# Patient Record
Sex: Female | Born: 1950 | Race: White | Hispanic: No | Marital: Single | Smoking: Never smoker
Health system: Southern US, Community
[De-identification: ages and names within clinical notes are randomized; demographics above are authoritative.]

## PROBLEM LIST (undated history)

## (undated) HISTORY — PX: TUBAL LIGATION: SHX77

---

## 2010-12-01 ENCOUNTER — Emergency Department (HOSPITAL_COMMUNITY): Payer: Self-pay

## 2010-12-01 ENCOUNTER — Inpatient Hospital Stay (HOSPITAL_COMMUNITY)
Admission: EM | Admit: 2010-12-01 | Discharge: 2010-12-06 | DRG: 087 | Disposition: A | Discharge status: Home / Self Care | Payer: No Typology Code available for payment source | Attending: General Surgery | Admitting: General Surgery

## 2010-12-01 DIAGNOSIS — S065X9A Traumatic subdural hemorrhage with loss of consciousness of unspecified duration, initial encounter: Secondary | ICD-10-CM

## 2010-12-01 DIAGNOSIS — S065XAA Traumatic subdural hemorrhage with loss of consciousness status unknown, initial encounter: Secondary | ICD-10-CM | POA: Diagnosis present

## 2010-12-01 DIAGNOSIS — M542 Cervicalgia: Secondary | ICD-10-CM | POA: Diagnosis present

## 2010-12-01 DIAGNOSIS — S0003XA Contusion of scalp, initial encounter: Secondary | ICD-10-CM

## 2010-12-01 DIAGNOSIS — S1093XA Contusion of unspecified part of neck, initial encounter: Secondary | ICD-10-CM

## 2010-12-01 DIAGNOSIS — S0083XA Contusion of other part of head, initial encounter: Secondary | ICD-10-CM

## 2010-12-01 LAB — PROTIME-INR: INR: 1.07 (ref 0.00–1.49)

## 2010-12-01 LAB — COMPREHENSIVE METABOLIC PANEL
ALT: 18 U/L (ref 0–35)
AST: 21 U/L (ref 0–37)
Calcium: 9.6 mg/dL (ref 8.4–10.5)
Sodium: 139 mEq/L (ref 135–145)
Total Protein: 7.4 g/dL (ref 6.0–8.3)

## 2010-12-01 LAB — CBC
HCT: 37.4 % (ref 36.0–46.0)
Platelets: 276 10*3/uL (ref 150–400)
RDW: 12.6 % (ref 11.5–15.5)
WBC: 11.7 10*3/uL — ABNORMAL HIGH (ref 4.0–10.5)

## 2010-12-01 LAB — LACTIC ACID, PLASMA: Lactic Acid, Venous: 1.7 mmol/L (ref 0.5–2.2)

## 2010-12-01 MED ORDER — IOHEXOL 300 MG/ML  SOLN
80.0000 mL | Freq: Once | INTRAMUSCULAR | Status: AC | PRN
  Administered 2010-12-01: 80 mL via INTRAVENOUS

## 2010-12-02 ENCOUNTER — Inpatient Hospital Stay (HOSPITAL_COMMUNITY): Payer: No Typology Code available for payment source

## 2010-12-02 MED ORDER — MORPHINE SULFATE 2 MG/ML IJ SOLN: 2.0000 mg | INTRAMUSCULAR | Status: DC | PRN

## 2010-12-02 MED ORDER — KCL IN DEXTROSE-NACL 20-5-0.45 MEQ/L-%-% IV SOLN
INTRAVENOUS | Status: DC
  Administered 2010-12-02 – 2010-12-03 (×3): via INTRAVENOUS
  Filled 2010-12-02 (×8): qty 1000

## 2010-12-02 MED ORDER — HYDROCODONE-ACETAMINOPHEN 5-325 MG PO TABS
1.0000 | ORAL_TABLET | ORAL | Status: DC | PRN
  Administered 2010-12-05: 1 via ORAL
  Administered 2010-12-05: 2 via ORAL
  Administered 2010-12-05 – 2010-12-06 (×4): 1 via ORAL

## 2010-12-02 MED ORDER — MORPHINE SULFATE 2 MG/ML IJ SOLN: 1.0000 mg | INTRAMUSCULAR | Status: DC | PRN

## 2010-12-02 MED ORDER — ONDANSETRON HCL 4 MG PO TABS
4.0000 mg | ORAL_TABLET | ORAL | Status: DC | PRN
Start: 2010-12-01 — End: 2010-12-02

## 2010-12-02 MED ORDER — MORPHINE SULFATE 4 MG/ML IJ SOLN: 4.0000 mg | INTRAMUSCULAR | Status: DC | PRN

## 2010-12-02 MED ORDER — BISACODYL 10 MG RE SUPP
10.0000 mg | RECTAL | Status: DC | PRN
  Administered 2010-12-03: 10 mg via RECTAL
  Filled 2010-12-02: qty 1

## 2010-12-02 MED ORDER — ONDANSETRON HCL 4 MG/2ML IJ SOLN
4.0000 mg | INTRAMUSCULAR | Status: DC | PRN
  Administered 2010-12-03 – 2010-12-04 (×2): 4 mg via INTRAVENOUS
  Filled 2010-12-02 (×2): qty 2

## 2010-12-02 MED ORDER — MORPHINE SULFATE 2 MG/ML IJ SOLN
1.0000 mg | INTRAMUSCULAR | Status: DC | PRN
  Administered 2010-12-03: 2 mg via INTRAVENOUS

## 2010-12-02 MED ORDER — DOCUSATE SODIUM 100 MG PO CAPS
100.0000 mg | ORAL_CAPSULE | Freq: Two times a day (BID) | ORAL | Status: DC
  Administered 2010-12-03 – 2010-12-06 (×6): 100 mg via ORAL
  Filled 2010-12-02 (×2): qty 1

## 2010-12-02 MED ORDER — PROMETHAZINE HCL 25 MG/ML IJ SOLN
12.5000 mg | Freq: Four times a day (QID) | INTRAMUSCULAR | Status: DC | PRN
  Administered 2010-12-03 (×2): 12.5 mg via INTRAVENOUS
  Filled 2010-12-02 (×4): qty 1

## 2010-12-02 MED ORDER — PANTOPRAZOLE SODIUM 40 MG IV SOLR
40.0000 mg | INTRAVENOUS | Status: DC
  Filled 2010-12-02 (×2): qty 40

## 2010-12-03 DIAGNOSIS — S065X9A Traumatic subdural hemorrhage with loss of consciousness of unspecified duration, initial encounter: Secondary | ICD-10-CM | POA: Diagnosis present

## 2010-12-03 DIAGNOSIS — S0003XA Contusion of scalp, initial encounter: Secondary | ICD-10-CM | POA: Diagnosis present

## 2010-12-03 LAB — BASIC METABOLIC PANEL
BUN: 7 mg/dL (ref 6–23)
CO2: 29 mEq/L (ref 19–32)
Chloride: 105 mEq/L (ref 96–112)
Creatinine, Ser: 0.6 mg/dL (ref 0.50–1.10)

## 2010-12-03 MED ORDER — MORPHINE SULFATE 2 MG/ML IJ SOLN: 2.0000 mg | INTRAMUSCULAR | Status: DC | PRN

## 2010-12-03 MED ORDER — MORPHINE SULFATE 2 MG/ML IJ SOLN
2.0000 mg | INTRAMUSCULAR | Status: DC | PRN
  Administered 2010-12-03 (×2): 2 mg via INTRAVENOUS

## 2010-12-03 MED ORDER — MORPHINE SULFATE 4 MG/ML IJ SOLN
2.0000 mg | INTRAMUSCULAR | Status: DC | PRN
  Administered 2010-12-04: 2 mg via INTRAVENOUS
  Administered 2010-12-04: 4 mg via INTRAVENOUS
  Administered 2010-12-04: 2 mg via INTRAVENOUS
  Administered 2010-12-04: 4 mg via INTRAVENOUS
  Administered 2010-12-04: 2 mg via INTRAVENOUS
  Administered 2010-12-04: 4 mg via INTRAVENOUS
  Administered 2010-12-05 (×2): 2 mg via INTRAVENOUS
  Filled 2010-12-03 (×6): qty 1
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
## 2010-12-04 MED ORDER — WHITE PETROLATUM GEL
Status: AC
  Filled 2010-12-04: qty 5
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
## 2010-12-04 NOTE — Progress Notes (Signed)
This patient has been seen and I agree with the findings and treatment plan.  Jaxton Casale O. Trung Wenzl, III, MD, FACS [phone] (pager) [phone] (direct pager) Trauma Surgeon  

## 2010-12-05 LAB — BASIC METABOLIC PANEL
CO2: 28 mEq/L (ref 19–32)
Calcium: 9.7 mg/dL (ref 8.4–10.5)
Chloride: 99 mEq/L (ref 96–112)
Creatinine, Ser: 0.61 mg/dL (ref 0.50–1.10)
Glucose, Bld: 112 mg/dL — ABNORMAL HIGH (ref 70–99)

## 2010-12-05 MED ORDER — ONDANSETRON HCL 4 MG PO TABS: 4.0000 mg | ORAL_TABLET | ORAL | Status: DC | PRN

## 2010-12-05 MED ORDER — HYDROCODONE-ACETAMINOPHEN 5-325 MG PO TABS: 1.0000 | ORAL_TABLET | ORAL | Status: DC | PRN

## 2010-12-05 MED ORDER — ONDANSETRON HCL 4 MG PO TABS: 4.0000 mg | ORAL_TABLET | ORAL | Status: AC | PRN
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
## 2010-12-05 NOTE — Plan of Care (Signed)
Problem: Phase II Progression Outcomes Goal: Other Phase II Outcomes/Goals Cognitive evaluation completed.

## 2010-12-05 NOTE — Progress Notes (Deleted)
Speech Language/Pathology Speech Language Pathology Treatment Patient Details Name: Patty Tucker MRN: [id number] DOB: 1950/09/24 Today's Date: 12/05/2010  SLP Assessment/Plan/Recommendation Assessment Clinical Impression Statement: Pt exhibits impairment of attention, awareness of deficits, poor recall, thought organization, mental flexibility, sequencing, reasoning, judgement and safety.  Pt lives with fiance, who is currently hospitalized as well.  Pt does not present as being safe to be alone at home, due to cognitive deficits. SLP Recommendation/Assessment: All further Speech Lanaguage Pathology  needs can be addressed in the next venue of care Recommendation Equipment Recommended: None recommended by SLP  SLP Goals     SLP Treatment General    Oral Cavity - Oral Hygiene     Assessment of Diet Tolerance Clinical Impression Statement: Pt exhibits impairment of attention, awareness of deficits, poor recall, thought organization, mental flexibility, sequencing, reasoning, judgement and safety.  Pt lives with fiance, who is currently hospitalized as well.  Pt does not present as being safe to be alone at home, due to cognitive deficits.   Leigh Aurora 12/05/2010, 12:52 PM
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
## 2010-12-05 NOTE — Progress Notes (Signed)
Physical Therapy Patient Details Name: Patty Tucker MRN: [id number] DOB: 1950/11/17 Today's Date: 12/05/2010  Pt demonstrated intermittent difficulties with cognition and decreased ability to attend to tasks with external distractions.  Pt's inability to divide attention affects balance and safe mobility.  Pt will be D/Cing home alone and notes she may be able to get family to A, but wasn't sure.  Spoke with SLP who will do a Cognitive Eval about concerns.  Concerned about pt's safety if home alone.    [facility] 12/05/2010, 11:58 AM
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
## 2010-12-06 NOTE — Progress Notes (Signed)
Physical Therapy Treatment Patient Details Name: Patty Tucker MRN: [id number] DOB: 03-28-1950 Today's Date: 12/06/2010  PT Assessment/Plan  PT - Assessment/Plan Comments on Treatment Session: Pt doing well.  she is ready for DC.  Pt wanting to go home and anticipates no problems.  I educated pt on how to slowly increase her activity level and using ice pack for (forehead) pain management PT Plan: Discharge plan remains appropriate;All goals met and education completed, patient dischaged from PT services Follow Up Recommendations: None Equipment Recommended: None recommended by PT PT Goals  Acute Rehab PT Goals PT Goal Formulation: With patient Pt will Ambulate: >150 feet;Independently PT Goal: Ambulate - Progress: Met Pt will Go Up / Down Stairs: Other (comment) (Pt verbalized technque adn didnt feel she needs to practice) PT Goal: Up/Down Stairs - Progress: Met  PT Treatment Precautions/Restrictions  Precautions Precautions: Fall Restrictions Weight Bearing Restrictions: No Mobility (including Balance) Bed Mobility Bed Mobility: No Transfers Transfers: Yes Sit to Stand: 7: Independent Stand to Sit: 7: Independent Ambulation/Gait Ambulation/Gait: Yes Ambulation/Gait Assistance: 5: Supervision Ambulation/Gait Assistance Details (indicate cue type and reason): Pt able to do marching, walking on toes, walking on heels and with head turns without any loss of balance or increased pain Ambulation Distance (Feet): 250 Feet Assistive device: None Gait Pattern: Within Functional Limits Gait velocity: Pt walks slowly.  I had her increase her speed without any problems wtih balance Stairs: No (Pt doesnt think she will have any problems with her steps ) Wheelchair Mobility Wheelchair Mobility: No   End of Session PT - End of Session Activity Tolerance: Patient tolerated treatment well Patient left: in chair General Behavior During Session: [employer] for tasks performed Cognition: [facility]  for tasks performed  Judson Roch 12/06/2010, 9:48 AM
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
## 2010-12-06 NOTE — Progress Notes (Signed)
     Subjective: Still ok for discharge.  Objective: Vital signs in last 24 hours: Temp:  [98 F (36.7 C)-98.8 F (37.1 C)] 98.5 F (36.9 C) (11/07 0536) Pulse Rate:  [64-74] 64  (11/07 0536) Resp:  [16-20] 17  (11/07 0536) BP: (113-144)/(65-75) 113/72 mmHg (11/07 0536) SpO2:  [95 %-97 %] 95 % (11/07 0536) Weight:  [63.368 kg (139 lb 11.2 oz)] 139 lb 11.2 oz (63.368 kg) (11/06 0944) Last BM Date: 12/03/10   General appearance: no distress Resp: clear to auscultation bilaterally Neurologic: Grossly normal  Assessment/Plan: D/c to home   LOS: 5 days    Jlyn Bracamonte J. 12/06/2010

## 2010-12-06 NOTE — Progress Notes (Signed)
Pt discharged home. Stated understanding of all discharge instructions, followups, medicines, prescriptions, and when to call the doctor. Pt left with 36 percocet tablets from our pharmacy and 4 zofran tablets. Pt discharged home with sister. Minor, Patty Tucker

## 2010-12-06 NOTE — Progress Notes (Signed)
CSW completed initial assessment with patient - assessment located on shadow chart.  Patient plans to dc home with fiance and assistance from patient sister.  Patient sister is to provide transportation home.  Patient states she does not drink any alcohol.  CSW completed SBIRT.  No further SW needs.  CSW signing off.  [address] [place], [place] [phone]

## 2010-12-14 ENCOUNTER — Telehealth: Payer: Self-pay | Admitting: Physician Assistant

## 2010-12-14 MED ORDER — HYDROCODONE-ACETAMINOPHEN 5-325 MG PO TABS: 1.0000 | ORAL_TABLET | ORAL | Status: AC | PRN

## 2010-12-14 NOTE — Telephone Encounter (Signed)
Called in refill for hydrocodone 

## 2010-12-23 ENCOUNTER — Emergency Department: Payer: Self-pay | Admitting: Emergency Medicine

## 2010-12-27 ENCOUNTER — Telehealth: Payer: Self-pay | Admitting: Physician Assistant

## 2010-12-27 NOTE — Telephone Encounter (Signed)
Left message with pt.'s boyfriend, [employer], that we would not refill Norco Rx again. Let the boyfriend know that she needs to follow up with her Neurosurgeon or her PCP

## 2011-07-01 ENCOUNTER — Observation Stay (HOSPITAL_COMMUNITY)
Admission: EM | Admit: 2011-07-01 | Discharge: 2011-07-02 | Disposition: A | Discharge status: Home / Self Care | Payer: Self-pay | Attending: Emergency Medicine | Admitting: Emergency Medicine

## 2011-07-01 ENCOUNTER — Encounter (HOSPITAL_COMMUNITY): Payer: Self-pay | Admitting: *Deleted

## 2011-07-01 DIAGNOSIS — IMO0001 Reserved for inherently not codable concepts without codable children: Secondary | ICD-10-CM | POA: Insufficient documentation

## 2011-07-01 DIAGNOSIS — Z23 Encounter for immunization: Secondary | ICD-10-CM | POA: Insufficient documentation

## 2011-07-01 DIAGNOSIS — L0390 Cellulitis, unspecified: Secondary | ICD-10-CM

## 2011-07-01 DIAGNOSIS — W5501XA Bitten by cat, initial encounter: Secondary | ICD-10-CM

## 2011-07-01 DIAGNOSIS — S81809A Unspecified open wound, unspecified lower leg, initial encounter: Secondary | ICD-10-CM | POA: Insufficient documentation

## 2011-07-01 DIAGNOSIS — Y92009 Unspecified place in unspecified non-institutional (private) residence as the place of occurrence of the external cause: Secondary | ICD-10-CM | POA: Insufficient documentation

## 2011-07-01 DIAGNOSIS — S81009A Unspecified open wound, unspecified knee, initial encounter: Principal | ICD-10-CM | POA: Insufficient documentation

## 2011-07-01 LAB — BASIC METABOLIC PANEL
BUN: 8 mg/dL (ref 6–23)
CO2: 26 mEq/L (ref 19–32)
Chloride: 103 mEq/L (ref 96–112)
Creatinine, Ser: 0.61 mg/dL (ref 0.50–1.10)

## 2011-07-01 LAB — CBC
HCT: 35.4 % — ABNORMAL LOW (ref 36.0–46.0)
MCV: 89.2 fL (ref 78.0–100.0)
Platelets: 266 10*3/uL (ref 150–400)
RBC: 3.97 MIL/uL (ref 3.87–5.11)
WBC: 9.3 10*3/uL (ref 4.0–10.5)

## 2011-07-01 MED ORDER — SODIUM CHLORIDE 0.9 % IV SOLN
20.0000 mL | INTRAVENOUS | Status: DC
  Administered 2011-07-01: 20 mL via INTRAVENOUS

## 2011-07-01 MED ORDER — SODIUM CHLORIDE 0.9 % IV SOLN
3.0000 g | Freq: Four times a day (QID) | INTRAVENOUS | Status: DC
  Administered 2011-07-01: 3 g via INTRAVENOUS
  Filled 2011-07-01: qty 3

## 2011-07-01 MED ORDER — MORPHINE SULFATE 4 MG/ML IJ SOLN
4.0000 mg | INTRAMUSCULAR | Status: DC | PRN
Start: 2011-07-01 — End: 2011-07-02
  Administered 2011-07-01: 4 mg via INTRAVENOUS
  Filled 2011-07-01: qty 1

## 2011-07-01 MED ORDER — ONDANSETRON HCL 4 MG/2ML IJ SOLN
4.0000 mg | Freq: Four times a day (QID) | INTRAMUSCULAR | Status: DC | PRN
  Administered 2011-07-01: 4 mg via INTRAVENOUS
  Filled 2011-07-01: qty 2

## 2011-07-01 MED ORDER — ACETAMINOPHEN 325 MG PO TABS: 650.0000 mg | ORAL_TABLET | ORAL | Status: DC | PRN

## 2011-07-01 NOTE — ED Notes (Signed)
Pt resting quietly with leg elevated to decrease swelling. Pt has had no increase swelling or redness from previously marked at this time, CNS intact. Plan of care is updated with verbal understanding and will continue to monitor pt.

## 2011-07-01 NOTE — ED Notes (Signed)
Pt request sleep aid, NP made aware and to follow up. Plan of care is updated with pt, pt verbalizes understanding and will continue to monitor pt.

## 2011-07-01 NOTE — ED Notes (Signed)
Report received, 1st contact with pt and will assume care of pt at this time. Pt will be medicated per [facility] previously ordered.

## 2011-07-01 NOTE — ED Provider Notes (Signed)
10:12 PM Patient is in CDU under observation, cellulitis protocol.  This is a shared visit with Grant Fontana, PA-C, and Dr Preston Fleeting.  Patient with cellulitis of her right lower leg following bite from her own cat yesterday.  She reports she is still in pain, but was just given morphine prior to my seeing her.  Patient has lines drawn around erythema, a red line drawn this morning by a nurse upstairs (where family member is hospitalized), and second line drawn this evening in ED.  Erythema is still within these lines.  Nurse is now starting antibiotics (Unasyn).  Patient to remain on protocol overnight.  Anticipate d/c home on Augmentin in the morning if improving/not worsening.    Grant Fontana, PA-C, to continue taking care of patient overnight.    Patty Tucker, [place] 07/01/11 2245

## 2011-07-01 NOTE — ED Notes (Signed)
The pts cat bit her  Rt lower leg yesterday.  Painful minimal swelling sl red

## 2011-07-01 NOTE — ED Notes (Signed)
Report given to [facility] and Pt trans. To CDU

## 2011-07-01 NOTE — ED Provider Notes (Signed)
History     CSN: [id number]  Arrival date & time 07/01/11  2028   First MD Initiated Contact with Patient 07/01/11 2046      Chief Complaint  Patient presents with  . Animal Bite    (Consider location/radiation/quality/duration/timing/severity/associated sxs/prior treatment) HPI History from patient. 61 year old female who presents with cat bite to the right lower leg. States this occurred yesterday. She was bitten by her cat, which is an outdoor cat. The animal has not had any vaccinations. Per the patient, the cat had been acting normally prior to this. It had never acted aggressively to her or bitten her in the past. She has had swelling, redness, and pain to the area since that time. Her husband is currently an inpatient; she had one of the nurses on his floor and mark off the area with surgical marker a few hours to presentation. States the area of redness has spread since it was marked off earlier. Denies f/c, change in appetite. Denies difficulty wt bearing/walking on the ext. She has no chronic medical conditions.  History reviewed. No pertinent past medical history.  History reviewed. No pertinent past surgical history.  No family history on file.  History  Substance Use Topics  . Smoking status: Never Smoker   . Smokeless tobacco: Not on file  . Alcohol Use: No    OB History    Grav Para Term Preterm Abortions TAB SAB Ect Mult Living                  Review of Systems  Constitutional: Negative for fever, chills, activity change and appetite change.  Respiratory: Negative for shortness of breath.   Cardiovascular: Negative for chest pain.  Gastrointestinal: Negative for nausea, vomiting, abdominal pain and diarrhea.  Musculoskeletal: Positive for myalgias.  Skin: Positive for color change and wound.  Neurological: Negative for weakness and numbness.    Allergies  Codeine  Home Medications   Current Outpatient Rx  Name Route Sig Dispense Refill  .  IBUPROFEN 200 MG PO TABS Oral Take 400 mg by mouth every 6 (six) hours as needed. For pain      BP 162/89  Pulse 99  Temp(Src) 98.5 F (36.9 C) (Oral)  Resp 18  SpO2 100%  Physical Exam  Nursing note and vitals reviewed. Constitutional: She appears well-developed and well-nourished. No distress.       afebrile  HENT:  Head: Normocephalic and atraumatic.  Neck: Normal range of motion.  Cardiovascular: Normal rate, regular rhythm and normal heart sounds.   Pulmonary/Chest: Effort normal and breath sounds normal. She exhibits no tenderness.  Abdominal: Soft. There is no tenderness.  Musculoskeletal: Normal range of motion.  Neurological: She is alert.  Skin: Skin is warm and dry. She is not diaphoretic.       2 puncture wounds to medial lower RLE, one of which is scabbed over and the other of which is draining purulent material. Surrounding cellulitis which has spread around borders which were previously outlined with surgical marker.  Psychiatric: She has a normal mood and affect.    ED Course  [place] bedside Performed by: Grant Fontana Authorized by: Grant Fontana Consent: Verbal consent obtained. Risks and benefits: risks, benefits and alternatives were discussed Consent given by: patient Comments: Small loculated appearing area subQ to wound, approx 1cm deep   INCISION AND DRAINAGE Performed by: Grant Fontana Consent: Verbal consent obtained. Risks and benefits: risks, benefits and alternatives were discussed Type: abscess  Body area: RLE  Anesthesia: local infiltration  Local anesthetic: lidocaine 2% with epinephrine  Anesthetic total:4 ml  Complexity: complex Blunt dissection to break up loculations - wound explored - wound approx 1cm in depth  Drainage: purulent, bloody  Drainage amount: small to moderate  Packing material: none  Patient tolerance: Patient tolerated the procedure well with no immediate complications.    Labs Reviewed    CBC - Abnormal; Notable for the following:    HCT 35.4 (*)    All other components within normal limits  BASIC METABOLIC PANEL - Abnormal; Notable for the following:    Glucose, Bld 110 (*)    All other components within normal limits  WOUND CULTURE  WOUND CULTURE   No results found.   No diagnosis found. 1) cat bite 2) cellulitis   MDM  9:00 PM Patient assessed. Presents s/p cat bite to RLE. Purulent drainage and cellulitis which has spread beyond borders drawn a few hours ago. Pt to be placed on CDU cellulitis protocol - discussed abx selection with Dr. Preston Fleeting - will start on Unasyn q6h. CBC/BMP ordered. Borders outlined with surgical marker and timed/dated. Cat is not UTD on rabies - animal control will be contacted to ensure cat is picked up and placed in quarantine.  12:30 AM Pt reassessed. No significant spread beyond borders. Puncture wound incised and wound extended to facilitate drainage which was productive of purulent and bloody drainage. Wound culture sent. Pt resting comfortably at this time.  5:30 AM Pt reassessed. S/P 2 doses Unasyn. Borders appear to have spread beyond outline drawn at 9 PM, although erythema appears less prominent. No continued drainage seen from wound.  6:00 AM Discussed with Dr. Dierdre Highman who talked with pt. Pt states she does not want to be admitted as she has to take home her boyfriend who is scheduled to be discharged from hospital this AM. She does not have money to afford medication, but is agreeable to wait to talk with case management when CM comes in for the morning to see if she can get medication assistance. Pt will be signed out to 7a CDU provider.  Plan prior to dc: 1) Discuss with CM re: med assistance: either Cipro and clindamycin OR Augmentin 2) Ensure arrangements have been made with Animal Control   Lizabeth Fellner, [place] 07/02/11 [phone]

## 2011-07-02 MED ORDER — TETANUS-DIPHTH-ACELL PERTUSSIS 5-2.5-18.5 LF-MCG/0.5 IM SUSP
0.5000 mL | Freq: Once | INTRAMUSCULAR | Status: AC
  Administered 2011-07-02: 0.5 mL via INTRAMUSCULAR
  Filled 2011-07-02: qty 0.5

## 2011-07-02 MED ORDER — IBUPROFEN 800 MG PO TABS: 800.0000 mg | ORAL_TABLET | Freq: Three times a day (TID) | ORAL | Status: AC

## 2011-07-02 MED ORDER — ZOLPIDEM TARTRATE 5 MG PO TABS
5.0000 mg | ORAL_TABLET | Freq: Once | ORAL | Status: AC
  Administered 2011-07-02: 5 mg via ORAL
  Filled 2011-07-02: qty 1

## 2011-07-02 MED ORDER — SODIUM CHLORIDE 0.9 % IV SOLN
3.0000 g | Freq: Four times a day (QID) | INTRAVENOUS | Status: DC
  Administered 2011-07-02: 3 g via INTRAVENOUS
  Filled 2011-07-02: qty 3

## 2011-07-02 NOTE — ED Provider Notes (Signed)
Medical screening examination/treatment/procedure(s) were conducted as a shared visit with non-physician practitioner(s) and myself.  I personally evaluated the patient during the encounter. Patient evaluated at 6 AM. Wound still erythematous the patient feels that subjectively is improving. Distal neurovascular intact right lower extremity. I long discussion with patient - she very much does not want to be admitted to the hospital for IV antibiotics. She is planning to take her fianc home was currently admitted to the hospital and states there is no way she can stay.  She is also unable to afford the prescription for antibiotics. I consult in case management and am able to get her a prescription for Augmentin which she agrees to take. She agrees to strict return precautions for any worsening condition.  Patient discharged home understanding recommendation for admission.  Sunnie Nielsen, MD 07/02/11 [phone]

## 2011-07-02 NOTE — ED Notes (Signed)
PA at bedside. Redness within marked area except to upper lateral area - noticed by PA.

## 2011-07-02 NOTE — ED Notes (Signed)
Warm blanket given and lights dimmed for comfort measures, will continue to monitor pt.

## 2011-07-02 NOTE — ED Notes (Signed)
NP at bedside for ultrasound of right leg, wound culture taken and will be sent to lab for testing.

## 2011-07-02 NOTE — Discharge Instructions (Signed)
Animal Bite  fill prescription for Augmentin antibiotics at our pharmacy which will fill this prescription at no charge to you. I recommend that you stay for admission for IV antibiotics.   Be evaluated immediately for any worsening condition.   Animal bite wounds can get infected. It is important to get proper medical treatment. Ask your doctor if you need a rabies shot. HOME CARE   Follow your doctor's instructions for taking care of your wound.   Only take medicine as told by your doctor.   Take your medicine (antibiotics) as told. Finish them even if you start to feel better.   Keep all doctor visits as told.  You may need a tetanus shot if:   You cannot remember when you had your last tetanus shot.   You have never had a tetanus shot.   The injury broke your skin.  If you need a tetanus shot and you choose not to have one, you may get tetanus. Sickness from tetanus can be serious. GET HELP RIGHT AWAY IF:   Your wound is warm, red, sore, or puffy (swollen).   You notice yellowish-white fluid (pus) or a bad smell coming from the wound.   You see a red line on the skin coming from the wound.   You have a fever, chills, or you feel sick.   You feel sick to your stomach (nauseous), or you throw up (vomit).   Your pain does not go away, or it gets worse.   You have trouble moving the injured part.   You have questions or concerns.  MAKE SURE YOU:   Understand these instructions.   Will watch your condition.   Will get help right away if you are not doing well or get worse.  Document Released: 01/15/2005 Document Revised: 01/04/2011 Document Reviewed: 09/06/2010 [facility] Patient Information 2012 [place], [place].

## 2011-07-04 LAB — WOUND CULTURE
Gram Stain: NONE SEEN
Special Requests: NORMAL

## 2011-07-05 NOTE — ED Provider Notes (Signed)
Medical screening examination/treatment/procedure(s) were performed by non-physician practitioner and as supervising physician I was immediately available for consultation/collaboration.   Dione Booze, MD 07/05/11 [id number]
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
## 2015-08-29 ENCOUNTER — Emergency Department
Admission: EM | Admit: 2015-08-29 | Discharge: 2015-08-29 | Disposition: A | Discharge status: Home / Self Care | Payer: Medicare Other | Attending: Emergency Medicine | Admitting: Emergency Medicine

## 2015-08-29 DIAGNOSIS — K0889 Other specified disorders of teeth and supporting structures: Secondary | ICD-10-CM | POA: Diagnosis present

## 2015-08-29 DIAGNOSIS — K047 Periapical abscess without sinus: Secondary | ICD-10-CM | POA: Diagnosis not present

## 2015-08-29 DIAGNOSIS — K029 Dental caries, unspecified: Secondary | ICD-10-CM

## 2015-08-29 MED ORDER — LIDOCAINE VISCOUS 2 % MT SOLN: 20.0000 mL | OROMUCOSAL | 0 refills | Status: AC | PRN

## 2015-08-29 MED ORDER — PROMETHAZINE HCL 25 MG PO TABS: 25.0000 mg | ORAL_TABLET | Freq: Four times a day (QID) | ORAL | 0 refills | Status: AC | PRN

## 2015-08-29 MED ORDER — HYDROCODONE-ACETAMINOPHEN 5-325 MG PO TABS
1.0000 | ORAL_TABLET | ORAL | 0 refills | Status: AC | PRN
Start: 2015-08-29 — End: ?

## 2015-08-29 MED ORDER — AMOXICILLIN 500 MG PO TABS: 500.0000 mg | ORAL_TABLET | Freq: Two times a day (BID) | ORAL | 0 refills | Status: AC

## 2015-08-29 NOTE — ED Triage Notes (Signed)
Pt c/o left upper tooth pain for the past couple of days and has mild facial swelling.Marland Kitchen
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
